# Patient Record
Sex: Male | Born: 2000 | Race: White | Hispanic: No | Marital: Single | Smoking: Never smoker
Health system: Southern US, Community
[De-identification: ages and names within clinical notes are randomized; demographics above are authoritative.]

---
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
## 2020-04-13 ENCOUNTER — Emergency Department: Payer: Medicaid Other

## 2020-04-13 ENCOUNTER — Other Ambulatory Visit: Payer: Self-pay

## 2020-04-13 ENCOUNTER — Emergency Department
Admission: EM | Admit: 2020-04-13 | Discharge: 2020-04-13 | Disposition: A | Discharge status: Home / Self Care | Payer: Medicaid Other | Attending: Emergency Medicine | Admitting: Emergency Medicine

## 2020-04-13 DIAGNOSIS — S0211AA Type I occipital condyle fracture, right side, initial encounter for closed fracture: Secondary | ICD-10-CM | POA: Diagnosis not present

## 2020-04-13 DIAGNOSIS — M5459 Other low back pain: Secondary | ICD-10-CM | POA: Diagnosis not present

## 2020-04-13 DIAGNOSIS — S02113A Unspecified occipital condyle fracture, initial encounter for closed fracture: Secondary | ICD-10-CM

## 2020-04-13 DIAGNOSIS — S0993XA Unspecified injury of face, initial encounter: Secondary | ICD-10-CM | POA: Diagnosis present

## 2020-04-13 LAB — COMPREHENSIVE METABOLIC PANEL
ALT: 15 U/L (ref 0–44)
AST: 18 U/L (ref 15–41)
Albumin: 4.9 g/dL (ref 3.5–5.0)
Alkaline Phosphatase: 60 U/L (ref 38–126)
Anion gap: 10 (ref 5–15)
BUN: 14 mg/dL (ref 6–20)
CO2: 26 mmol/L (ref 22–32)
Calcium: 9.2 mg/dL (ref 8.9–10.3)
Chloride: 105 mmol/L (ref 98–111)
Creatinine, Ser: 0.86 mg/dL (ref 0.61–1.24)
GFR, Estimated: >60 mL/min (ref >60)
Glucose, Bld: 108 mg/dL — ABNORMAL HIGH (ref 70–99)
Potassium: 4.1 mmol/L (ref 3.5–5.1)
Sodium: 141 mmol/L (ref 135–145)
Total Bilirubin: 0.9 mg/dL (ref 0.3–1.2)
Total Protein: 7.8 g/dL (ref 6.5–8.1)

## 2020-04-13 LAB — CBC WITH DIFFERENTIAL/PLATELET
Abs Immature Granulocytes: 0.07 10*3/uL (ref 0.00–0.07)
Basophils Absolute: 0 10*3/uL (ref 0.0–0.1)
Basophils Relative: 0 %
Eosinophils Absolute: 0 10*3/uL (ref 0.0–0.5)
Eosinophils Relative: 0 %
HCT: 45.2 % (ref 39.0–52.0)
Hemoglobin: 15.4 g/dL (ref 13.0–17.0)
Immature Granulocytes: 0 %
Lymphocytes Relative: 4 %
Lymphs Abs: 0.8 10*3/uL (ref 0.7–4.0)
MCH: 30.3 pg (ref 26.0–34.0)
MCHC: 34.1 g/dL (ref 30.0–36.0)
MCV: 88.8 fL (ref 80.0–100.0)
Monocytes Absolute: 1.4 10*3/uL — ABNORMAL HIGH (ref 0.1–1.0)
Monocytes Relative: 7 %
Neutro Abs: 16.3 10*3/uL — ABNORMAL HIGH (ref 1.7–7.7)
Neutrophils Relative %: 89 %
Platelets: 179 10*3/uL (ref 150–400)
RBC: 5.09 MIL/uL (ref 4.22–5.81)
RDW: 12.9 % (ref 11.5–15.5)
WBC: 18.5 10*3/uL — ABNORMAL HIGH (ref 4.0–10.5)
nRBC: 0 % (ref 0.0–0.2)

## 2020-04-13 MED ORDER — IOHEXOL 350 MG/ML SOLN
75.0000 mL | Freq: Once | INTRAVENOUS | Status: AC | PRN
  Administered 2020-04-13: 75 mL via INTRAVENOUS
  Filled 2020-04-13: qty 75

## 2020-04-13 MED ORDER — HYDROCODONE-ACETAMINOPHEN 5-325 MG PO TABS: 1.0000 | ORAL_TABLET | ORAL | 0 refills | Status: AC | PRN

## 2020-04-13 MED ORDER — PROMETHAZINE HCL 25 MG PO TABS: 25.0000 mg | ORAL_TABLET | Freq: Four times a day (QID) | ORAL | 2 refills | Status: AC | PRN

## 2020-04-13 MED ORDER — PROMETHAZINE HCL 25 MG PO TABS: 25.0000 mg | ORAL_TABLET | Freq: Once | ORAL | Status: DC

## 2020-04-13 MED ORDER — PROMETHAZINE HCL 25 MG/ML IJ SOLN
25.0000 mg | Freq: Once | INTRAMUSCULAR | Status: AC
  Administered 2020-04-13: 25 mg via INTRAVENOUS
  Filled 2020-04-13: qty 1

## 2020-04-13 MED ORDER — ONDANSETRON 8 MG PO TBDP
8.0000 mg | ORAL_TABLET | Freq: Once | ORAL | Status: AC
  Administered 2020-04-13: 8 mg via ORAL
  Filled 2020-04-13: qty 1
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
## 2020-04-13 NOTE — Progress Notes (Signed)
I have discussed the case with the ER provider and reviewed the CT head and C spine. I agree with attached reports. Neuro exam reported as intact. Nondisplaced nonopen fracture of right occipital bone with extension to right occipital condyle. Does not enter vert foramen, but does cross the transverse sinus. Recommend cervical collar and a CTV to ensure no venous sinus injury. If none noted can follow up in neurosurgery clinic at [facility] clinic in 2-4 weeks with repeat upright xrays. ADI BDI And powers ratio appropriate on C spine imaging.  Noralee Stain, MD

## 2020-04-13 NOTE — ED Notes (Signed)
Rigid C-collar applied for c/o neck tenderness. Officer is with patient. Patient is alert and oriented.

## 2021-02-10 ENCOUNTER — Other Ambulatory Visit: Payer: Self-pay

## 2021-02-10 ENCOUNTER — Emergency Department (HOSPITAL_COMMUNITY)
Admission: EM | Admit: 2021-02-10 | Discharge: 2021-02-11 | Disposition: A | Discharge status: Home / Self Care | Payer: Medicaid Other | Attending: Emergency Medicine | Admitting: Emergency Medicine

## 2021-02-10 ENCOUNTER — Encounter (HOSPITAL_COMMUNITY): Payer: Self-pay | Admitting: *Deleted

## 2021-02-10 ENCOUNTER — Emergency Department (HOSPITAL_COMMUNITY): Payer: Medicaid Other

## 2021-02-10 DIAGNOSIS — Z5321 Procedure and treatment not carried out due to patient leaving prior to being seen by health care provider: Secondary | ICD-10-CM | POA: Diagnosis not present

## 2021-02-10 DIAGNOSIS — U071 COVID-19: Secondary | ICD-10-CM | POA: Insufficient documentation

## 2021-02-10 DIAGNOSIS — R059 Cough, unspecified: Secondary | ICD-10-CM | POA: Diagnosis present

## 2021-02-10 LAB — CBC WITH DIFFERENTIAL/PLATELET
Abs Immature Granulocytes: 0.01 10*3/uL (ref 0.00–0.07)
Basophils Absolute: 0 10*3/uL (ref 0.0–0.1)
Basophils Relative: 0 %
Eosinophils Absolute: 0 10*3/uL (ref 0.0–0.5)
Eosinophils Relative: 0 %
HCT: 46.9 % (ref 39.0–52.0)
Hemoglobin: 15.7 g/dL (ref 13.0–17.0)
Immature Granulocytes: 0 %
Lymphocytes Relative: 15 %
Lymphs Abs: 0.7 10*3/uL (ref 0.7–4.0)
MCH: 29.4 pg (ref 26.0–34.0)
MCHC: 33.5 g/dL (ref 30.0–36.0)
MCV: 87.8 fL (ref 80.0–100.0)
Monocytes Absolute: 1.1 10*3/uL — ABNORMAL HIGH (ref 0.1–1.0)
Monocytes Relative: 23 %
Neutro Abs: 3 10*3/uL (ref 1.7–7.7)
Neutrophils Relative %: 62 %
Platelets: 162 10*3/uL (ref 150–400)
RBC: 5.34 MIL/uL (ref 4.22–5.81)
RDW: 12.9 % (ref 11.5–15.5)
WBC: 4.9 10*3/uL (ref 4.0–10.5)
nRBC: 0 % (ref 0.0–0.2)

## 2021-02-10 LAB — BASIC METABOLIC PANEL
Anion gap: 11 (ref 5–15)
BUN: 15 mg/dL (ref 6–20)
CO2: 21 mmol/L — ABNORMAL LOW (ref 22–32)
Calcium: 9 mg/dL (ref 8.9–10.3)
Chloride: 105 mmol/L (ref 98–111)
Creatinine, Ser: 1.3 mg/dL — ABNORMAL HIGH (ref 0.61–1.24)
GFR, Estimated: >60 mL/min (ref >60)
Glucose, Bld: 94 mg/dL (ref 70–99)
Potassium: 3.3 mmol/L — ABNORMAL LOW (ref 3.5–5.1)
Sodium: 137 mmol/L (ref 135–145)

## 2021-02-10 LAB — RESP PANEL BY RT-PCR (FLU A&B, COVID) ARPGX2
Influenza A by PCR: NEGATIVE
Influenza B by PCR: NEGATIVE
SARS Coronavirus 2 by RT PCR: POSITIVE — AB

## 2021-02-10 MED ORDER — ACETAMINOPHEN 325 MG PO TABS
650.0000 mg | ORAL_TABLET | Freq: Once | ORAL | Status: DC
  Filled 2021-02-10: qty 2

## 2021-02-10 NOTE — ED Provider Notes (Signed)
Emergency Medicine Provider Triage Evaluation Note  Stephen Buchanan , a 20 y.o. male  was evaluated in triage.  Pt complains of nonproductive cough and nasal congestion x1 day patient states he was unable to sleep last night because he was feeling so poorly.  He took ibuprofen last night.  He denies any known COVID exposures.  Also endorses body aches.  Did not check his temperature prior to arrival.  Review of Systems  Positive: Cough, nasal congestion Negative: Shortness of breath, chest pain, nausea, vomiting  Physical Exam  BP (!) 129/92 (BP Location: Left Arm)   Pulse (!) 137   Temp (!) 103.1 F (39.5 C)   Resp 18   Ht 5\' 6"  (1.676 m)   Wt 79 kg   SpO2 95%   BMI 28.11 kg/m  Gen:   Awake, no distress   Resp:  Normal effort  MSK:   Moves extremities without difficulty  Other:  Nasal congestion, no exudate seen on tonsils.  Tachycardic to 137.  Medical Decision Making  Medically screening exam initiated at 6:14 PM.  Appropriate orders placed.  was informed that the remainder of the evaluation will be completed by another provider, this initial triage assessment does not replace that evaluation, and the importance of remaining in the ED until their evaluation is complete.  Patient febrile and tachycardic here in triage.  Has URI symptoms.  Basic labs and chest x-ray ordered with COVID test.   Portions of this note were generated with Dragon dictation software. Dictation errors may occur despite best attempts at proofreading.    Robet Leu, PA-C 02/10/21 1816    02/12/21, DO 02/10/21 2212

## 2021-02-10 NOTE — ED Notes (Signed)
Patient moved to appropriate seating area 

## 2021-02-10 NOTE — ED Triage Notes (Signed)
The pt is c/o a cough and a runny nose since last pm

## 2021-02-11 NOTE — ED Notes (Signed)
Patient called x1 for vitals recheck with no response 

## 2021-02-11 NOTE — ED Notes (Signed)
Pt called x2 for vitals with no response and not visible in the lobby

## 2021-02-14 ENCOUNTER — Emergency Department (HOSPITAL_COMMUNITY)
Admission: EM | Admit: 2021-02-14 | Discharge: 2021-02-14 | Disposition: A | Discharge status: Home / Self Care | Payer: Medicaid Other | Attending: Emergency Medicine | Admitting: Emergency Medicine

## 2021-02-14 ENCOUNTER — Other Ambulatory Visit: Payer: Self-pay

## 2021-02-14 DIAGNOSIS — U071 COVID-19: Secondary | ICD-10-CM | POA: Diagnosis not present

## 2021-02-14 DIAGNOSIS — Z28310 Unvaccinated for covid-19: Secondary | ICD-10-CM | POA: Diagnosis not present

## 2021-02-14 DIAGNOSIS — R059 Cough, unspecified: Secondary | ICD-10-CM | POA: Diagnosis present

## 2021-02-14 LAB — SARS CORONAVIRUS 2 (TAT 6-24 HRS): SARS Coronavirus 2: POSITIVE — AB

## 2021-02-14 NOTE — ED Triage Notes (Signed)
Pt had positive covid test a couple days ago and is here for a paper copy of test. Would also like another covid test for work.

## 2021-02-14 NOTE — Discharge Instructions (Addendum)
You have COVID-19.  Please stay at home, you can return back to work on Monday given that you have had symptoms now for 5 days.  Continue to wear a mask when you are in public.

## 2021-02-14 NOTE — ED Provider Notes (Signed)
[facility] EMERGENCY DEPARTMENT Provider Note   CSN: [id number] Arrival date & time: 02/14/21  1137     History No chief complaint on file.   Stephen Buchanan is a 20 y.o. male.  HPI  Patient presents with cough, night sweats, body aches x5 days.  He tested positive for COVID 2 days ago, states he is reporting to the ED today to get a work note.  He also request to have the test read done.  He is not COVID vaccinated, is not any difficulty breathing or chest pain.  He has tried Tylenol with no relief.  No past medical history on file.  There are no problems to display for this patient.   No past surgical history on file.     No family history on file.  Social History   Tobacco Use   Smoking status: Never   Smokeless tobacco: Never  Vaping Use   Vaping Use: Never used  Substance Use Topics   Alcohol use: No   Drug use: No    Home Medications Prior to Admission medications   Medication Sig Start Date End Date Taking? Authorizing Provider  HYDROcodone-acetaminophen (NORCO/VICODIN) 5-325 MG tablet Take 1 tablet by mouth every 4 (four) hours as needed for moderate pain. 04/13/20   Cuthriell, Delorise Royals, PA-C  naproxen (NAPROSYN) 500 MG tablet Take 1 tablet (500 mg total) by mouth 2 (two) times daily with a meal. 05/22/18   Tommi Rumps, PA-C  promethazine (PHENERGAN) 25 MG tablet Take 1 tablet (25 mg total) by mouth every 6 (six) hours as needed for nausea or vomiting. 04/13/20   Cuthriell, Delorise Royals, PA-C    Allergies    Patient has no known allergies.  Review of Systems   Review of Systems  Constitutional:  Positive for chills. Negative for fever.  HENT:  Positive for congestion.   Respiratory:  Positive for cough.    Physical Exam Updated Vital Signs BP 124/84 (BP Location: Left Arm)   Pulse 98   Temp 98 F (36.7 C)   Resp 18   SpO2 100%   Physical Exam Vitals and nursing note reviewed. Exam conducted with a chaperone present.   Constitutional:      General: He is not in acute distress.    Appearance: Normal appearance.  HENT:     Head: Normocephalic and atraumatic.  Eyes:     General: No scleral icterus.    Extraocular Movements: Extraocular movements intact.     Pupils: Pupils are equal, round, and reactive to light.  Cardiovascular:     Rate and Rhythm: Normal rate and regular rhythm.  Pulmonary:     Effort: Pulmonary effort is normal.     Breath sounds: Normal breath sounds.     Comments: Lungs CTA bilaterally  Skin:    Coloration: Skin is not jaundiced.  Neurological:     Mental Status: He is alert. Mental status is at baseline.     Coordination: Coordination normal.    ED Results / Procedures / Treatments   Labs (all labs ordered are listed, but only abnormal results are displayed) Labs Reviewed  SARS CORONAVIRUS 2 (TAT 6-24 HRS)    EKG None  Radiology No results found.  Procedures Procedures   Medications Ordered in ED Medications - No data to display  ED Course  I have reviewed the triage vital signs and the nursing notes.  Pertinent labs & imaging results that were available during my care of the  patient were reviewed by me and considered in my medical decision making (see chart for details).    MDM Rules/Calculators/A&P                           Patient vitals are stable, symptoms were going on for 5 days.  He does not have any adventitious lung sounds on exam.  He is satting 100% on room air, no concerns for respiratory distress.  Advised to try symptomatic management, given work note, retested for COVID even though he had a positive test 2 days ago for documentation purposes.  Return precautions given, patient will be discharged . Final Clinical Impression(s) / ED Diagnoses Final diagnoses:  COVID-19    Rx / DC Orders ED Discharge Orders     None        Theron Arista, [place] 02/14/21 1146    Wynetta Fines, MD 02/16/21 [phone]
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
## 2022-01-18 IMAGING — CR DG CHEST 1V
1 series · 1 of 1 positions shown · non-contrast
Comparison: None.

CLINICAL DATA: Cough and fever.

EXAM:
CHEST  1 VIEW

[chest pa]
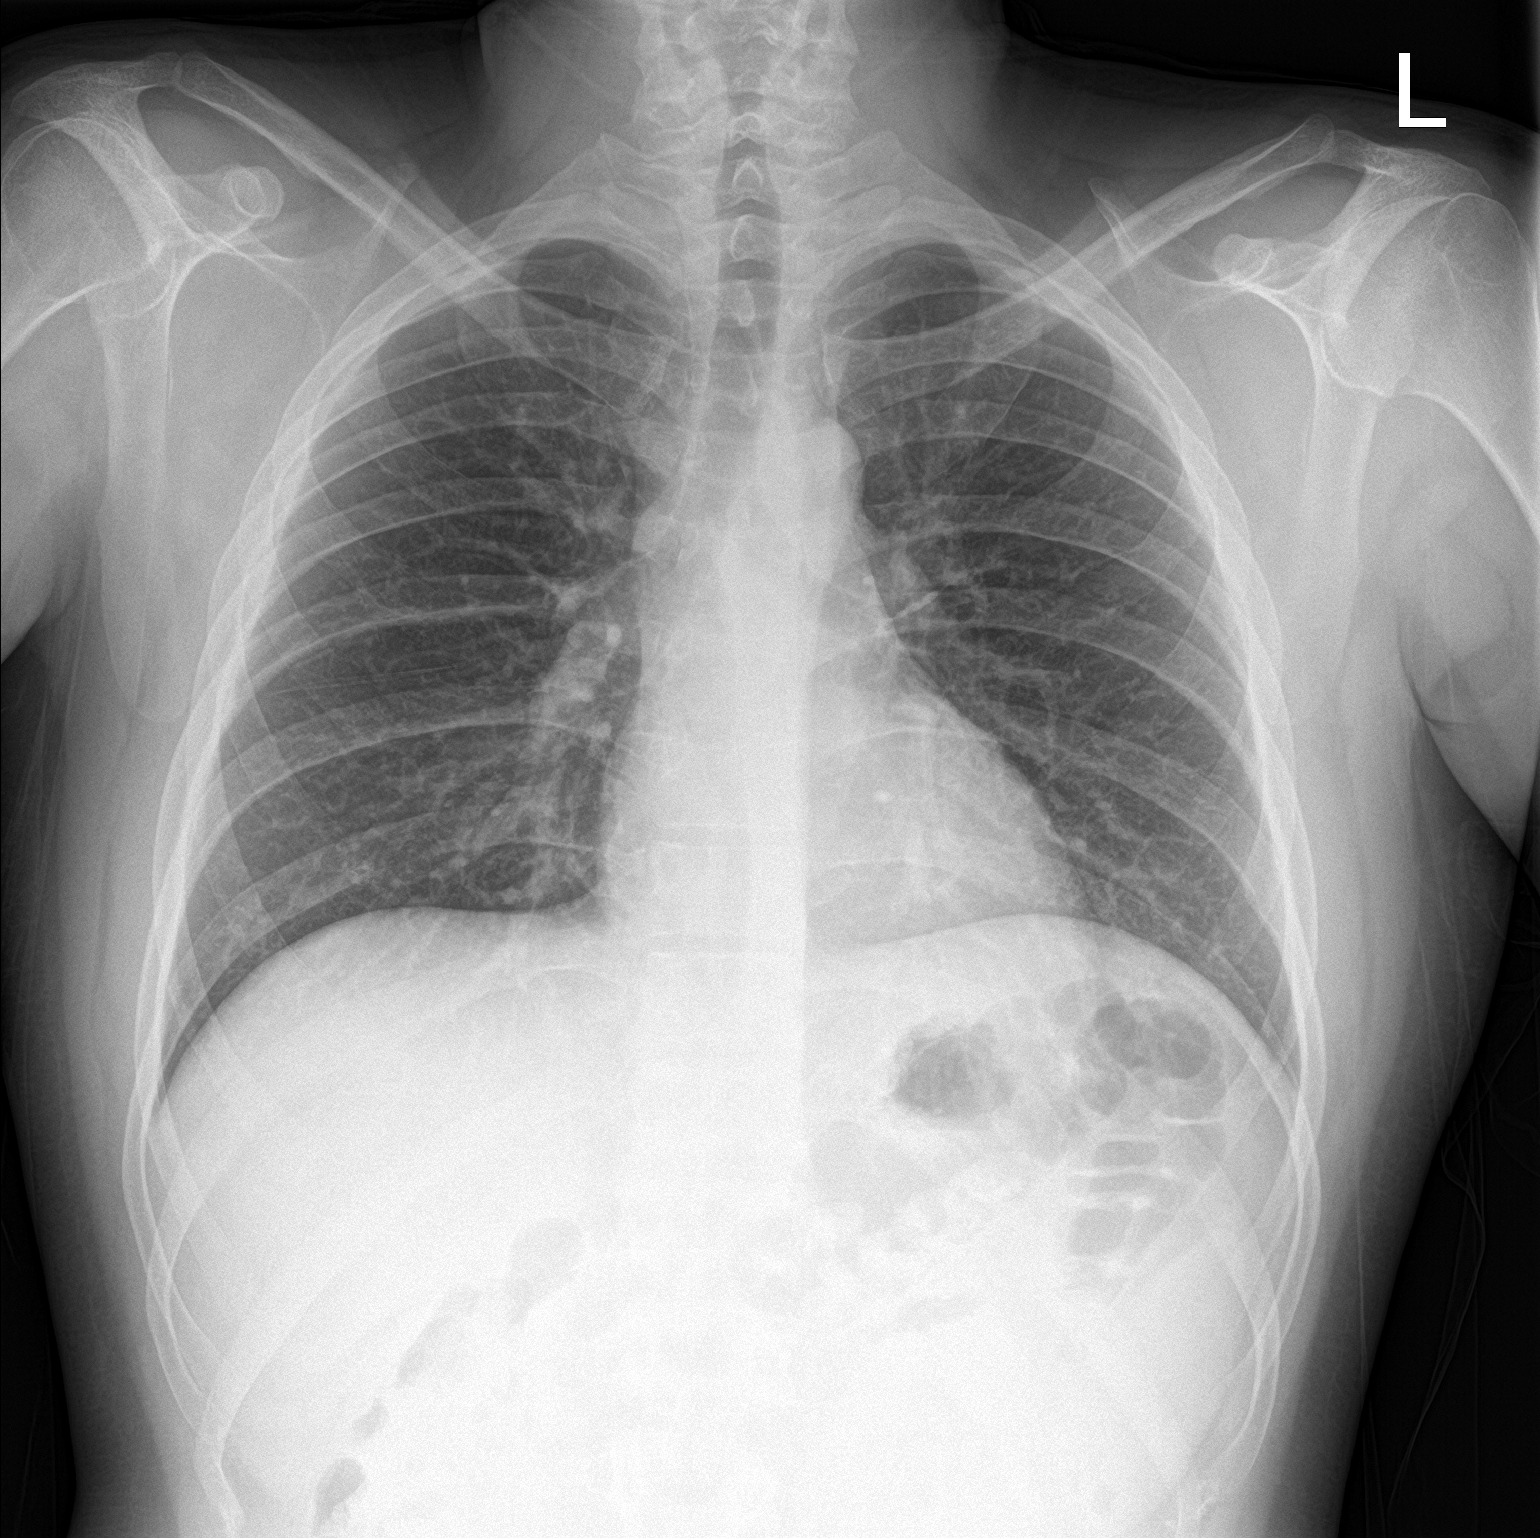

[1 of 1 positions shown; findings below may reference images not displayed]

FINDINGS: The cardiomediastinal contours are normal. The lungs are clear.
Pulmonary vasculature is normal. No consolidation, pleural effusion,
or pneumothorax. No acute osseous abnormalities are seen.
IMPRESSION: Negative frontal view of the chest.

## 2023-02-26 ENCOUNTER — Ambulatory Visit (HOSPITAL_COMMUNITY)
Admission: EM | Admit: 2023-02-26 | Discharge: 2023-02-26 | Disposition: A | Discharge status: Home / Self Care | Payer: Medicaid Other | Attending: Family Medicine | Admitting: Family Medicine

## 2023-02-26 ENCOUNTER — Encounter (HOSPITAL_COMMUNITY): Payer: Self-pay

## 2023-02-26 DIAGNOSIS — K5900 Constipation, unspecified: Secondary | ICD-10-CM | POA: Insufficient documentation

## 2023-02-26 DIAGNOSIS — J069 Acute upper respiratory infection, unspecified: Secondary | ICD-10-CM | POA: Diagnosis present

## 2023-02-26 DIAGNOSIS — Z1152 Encounter for screening for COVID-19: Secondary | ICD-10-CM

## 2023-02-26 LAB — SARS CORONAVIRUS 2 (TAT 6-24 HRS): SARS Coronavirus 2: NEGATIVE

## 2023-02-26 MED ORDER — POLYETHYLENE GLYCOL 3350 17 GM/SCOOP PO POWD: 1.0000 | Freq: Once | ORAL | 0 refills | Status: AC

## 2023-02-26 MED ORDER — POLYETHYLENE GLYCOL 3350 17 GM/SCOOP PO POWD: 1.0000 | Freq: Once | ORAL | 0 refills | Status: DC

## 2023-02-26 NOTE — ED Provider Notes (Signed)
[facility]    CSN: [id number] Arrival date & time: 02/26/23  0802      History   Chief Complaint Chief Complaint  Patient presents with   Constipation   Nasal Congestion   Cough   Fatigue    HPI Stephen Buchanan is a 22 y.o. male.   Constipation Patient here with a 2-week history of are not symptoms of constipation.  Patient has been taking over-the-counter laxative which has helped produce small scants amount of stool however after discontinuing laxatives the constipation symptoms persist.  Patient reports prior to this week he has been eating food prepared by fast food restaurants, not drinking as much water and does not eat many vegetables or fruits.  This week he has started increasing fiber and drinking more water.  Patient denies any prior history of problems with constipation.  He endorses some left-sided abdominal cramping with an urge to have a bowel movement however this left-sided abdominal pain only occurs intermittently.  Denies nausea or vomiting.  Fatigue, Cough, Congestion Patient here today with cough, nasal congestion and generalized fatigue over the last few days.  He reports symptoms have been improving slowly with medications over-the-counter.  He reports several people at work have had COVID he would like to get a COVID test to rule out COVID virus as a source of symptoms.   History reviewed. No pertinent past medical history.  There are no problems to display for this patient.   History reviewed. No pertinent surgical history.     Home Medications    Prior to Admission medications   Medication Sig Start Date End Date Taking? Authorizing Provider  HYDROcodone-acetaminophen (NORCO/VICODIN) 5-325 MG tablet Take 1 tablet by mouth every 4 (four) hours as needed for moderate pain. 04/13/20   Cuthriell, Delorise Royals, PA-C  naproxen (NAPROSYN) 500 MG tablet Take 1 tablet (500 mg total) by mouth 2 (two) times daily with a meal. 05/22/18   Tommi Rumps, PA-C  promethazine (PHENERGAN) 25 MG tablet Take 1 tablet (25 mg total) by mouth every 6 (six) hours as needed for nausea or vomiting. 04/13/20   Cuthriell, Delorise Royals, PA-C    Family History Family History  Problem Relation Age of Onset   Healthy Mother    Healthy Father     Social History Social History   Tobacco Use   Smoking status: Never   Smokeless tobacco: Never  Vaping Use   Vaping status: Some Days   Substances: Nicotine, Flavoring  Substance Use Topics   Alcohol use: No   Drug use: No     Allergies   Patient has no known allergies.   Review of Systems Review of Systems Pertinent negatives listed in HPI   Physical Exam Triage Vital Signs ED Triage Vitals  Encounter Vitals Group     BP 02/26/23 0823 139/82     Systolic BP Percentile --      Diastolic BP Percentile --      Pulse Rate 02/26/23 0823 93     Resp 02/26/23 0823 16     Temp 02/26/23 0823 98.1 F (36.7 C)     Temp Source 02/26/23 0823 Oral     SpO2 02/26/23 0823 97 %     Weight --      Height --      Head Circumference --      Peak Flow --      Pain Score 02/26/23 0825 0     Pain Loc --  Pain Education --      Exclude from Growth Chart --    No data found.  Updated Vital Signs BP 139/82 (BP Location: Left Arm)   Pulse 93   Temp 98.1 F (36.7 C) (Oral)   Resp 16   SpO2 97%   Visual Acuity Right Eye Distance:   Left Eye Distance:   Bilateral Distance:    Right Eye Near:   Left Eye Near:    Bilateral Near:     Physical Exam Vitals reviewed.  Constitutional:      Appearance: Normal appearance.  HENT:     Head: Normocephalic and atraumatic.  Eyes:     Extraocular Movements: Extraocular movements intact.     Conjunctiva/sclera: Conjunctivae normal.     Pupils: Pupils are equal, round, and reactive to light.  Cardiovascular:     Rate and Rhythm: Normal rate and regular rhythm.  Pulmonary:     Effort: Pulmonary effort is normal.     Breath sounds: Normal  breath sounds.  Abdominal:     General: Abdomen is protuberant. Bowel sounds are increased.     Palpations: Abdomen is soft. There is no splenomegaly or mass.     Tenderness: There is abdominal tenderness in the left upper quadrant and left lower quadrant. There is no guarding or rebound.  Musculoskeletal:     Cervical back: Normal range of motion and neck supple.  Skin:    General: Skin is warm and dry.  Neurological:     General: No focal deficit present.     Mental Status: He is alert.    UC Treatments / Results  Labs (all labs ordered are listed, but only abnormal results are displayed) Labs Reviewed  SARS CORONAVIRUS 2 (TAT 6-24 HRS)    EKG   Radiology No results found.  Procedures Procedures (including critical care time)  Medications Ordered in UC Medications - No data to display  Initial Impression / Assessment and Plan / UC Course  I have reviewed the triage vital signs and the nursing notes.  Pertinent labs & imaging results that were available during my care of the patient were reviewed by me and considered in my medical decision making (see chart for details).    Constipation recommended trial of Miralax and daily stool softener until bowel habits normalize. Encouraged hydrating well with water and incorporating more routine fruits and vegetable in diet.  COVID/Flu test pending. Symptom management warranted only.  Manage fever with Tylenol and ibuprofen.  Nasal symptoms with over-the-counter antihistamines recommended. Return precautions given. Final Clinical Impressions(s) / UC Diagnoses   Final diagnoses:  Constipation, unspecified constipation type  Encounter for screening for COVID-19  Viral URI with cough     Discharge Instructions      Your COVID 19 results will be available in 24 hours. Negative and Positive results are immediately resulted to Mychart. You may take over the counter cough and cold medications for symptom management.  For  constipation you were prescribed MiraLAX mix 1 cup full of MiraLAX with any beverage and drink twice daily until constipation symptoms resolved.  Also recommend a daily stool softener pills (Colace or similar store brands) for  constipation symptoms have completely resolved.  If symptoms do not improve and you continue to have constipation symptoms please follow-up with your primary care provider for further workup and evaluation.    ED Prescriptions     Medication Sig Dispense Auth. Provider   polyethylene glycol powder (MIRALAX) 17 GM/SCOOP powder  (  Status: Discontinued) Take 255 g by mouth once for 1 dose. 255 g Bing Neighbors, NP   polyethylene glycol powder (MIRALAX) 17 GM/SCOOP powder Take 255 g by mouth once for 1 dose. 255 g Bing Neighbors, NP      PDMP not reviewed this encounter.   Bing Neighbors, NP 03/02/23 [phone]

## 2023-02-26 NOTE — Discharge Instructions (Addendum)
Your COVID 19 results will be available in 24 hours. Negative and Positive results are immediately resulted to Mychart. You may take over the counter cough and cold medications for symptom management.  For constipation you were prescribed MiraLAX mix 1 cup full of MiraLAX with any beverage and drink twice daily until constipation symptoms resolved.  Also recommend a daily stool softener pills (Colace or similar store brands) for  constipation symptoms have completely resolved.  If symptoms do not improve and you continue to have constipation symptoms please follow-up with your primary care provider for further workup and evaluation.

## 2023-02-26 NOTE — ED Triage Notes (Signed)
Patient c/o constipation and states his alst normal BM was 2  weeks ago. Patient states he takes Ex-Lax and prune juice, but then he can't go again for several days.  Patient also c/o nasal congestion, a non productive cough, sore throat, and fatigue x 1 week.  Patient states he has been using cough drops.

## 2023-04-08 ENCOUNTER — Ambulatory Visit: Payer: Medicaid Other | Admitting: Family Medicine
